# Patient Record
Sex: Male | Born: 1955 | Race: Black or African American | Hispanic: No
Health system: Southern US, Community
[De-identification: ages and names within clinical notes are randomized; demographics above are authoritative.]

---

## 2013-04-22 ENCOUNTER — Inpatient Hospital Stay: Payer: Self-pay | Admitting: Specialist

## 2013-04-22 LAB — COMPREHENSIVE METABOLIC PANEL
Albumin: 4.2 g/dL (ref 3.4–5.0)
Alkaline Phosphatase: 114 U/L (ref 50–136)
BUN: 15 mg/dL (ref 7–18)
Calcium, Total: 9.6 mg/dL (ref 8.5–10.1)
Creatinine: 1.4 mg/dL — ABNORMAL HIGH (ref 0.60–1.30)
EGFR (African American): >60
Osmolality: 288 (ref 275–301)
Potassium: 3.6 mmol/L (ref 3.5–5.1)
Sodium: 141 mmol/L (ref 136–145)
Total Protein: 8.2 g/dL (ref 6.4–8.2)

## 2013-04-22 LAB — ETHANOL
Ethanol %: <0.003 % (ref 0.000–0.080)
Ethanol: <3 mg/dL

## 2013-04-22 LAB — IRON AND TIBC
Iron Bind.Cap.(Total): 401 ug/dL (ref 250–450)
Iron Saturation: 35 %

## 2013-04-22 LAB — DRUG SCREEN, URINE
Amphetamines, Ur Screen: NEGATIVE (ref ?–1000)
Barbiturates, Ur Screen: NEGATIVE (ref ?–200)
Benzodiazepine, Ur Scrn: NEGATIVE (ref ?–200)
Cocaine Metabolite,Ur ~~LOC~~: NEGATIVE (ref ?–300)
MDMA (Ecstasy)Ur Screen: NEGATIVE (ref ?–500)
Methadone, Ur Screen: NEGATIVE (ref ?–300)

## 2013-04-22 LAB — URINALYSIS, COMPLETE
Glucose,UR: NEGATIVE mg/dL (ref 0–75)
Nitrite: NEGATIVE
Ph: 5 (ref 4.5–8.0)
Protein: NEGATIVE
Specific Gravity: 1.029 (ref 1.003–1.030)

## 2013-04-22 LAB — CBC
HCT: 41.9 % (ref 40.0–52.0)
MCH: 23.3 pg — ABNORMAL LOW (ref 26.0–34.0)
MCHC: 30.4 g/dL — ABNORMAL LOW (ref 32.0–36.0)
Platelet: 231 10*3/uL (ref 150–440)
RDW: 17.4 % — ABNORMAL HIGH (ref 11.5–14.5)

## 2013-04-22 LAB — FERRITIN: Ferritin (ARMC): 66 ng/mL (ref 8–388)

## 2013-04-22 LAB — PROTIME-INR
INR: 3.2
Prothrombin Time: 31.7 secs — ABNORMAL HIGH (ref 11.5–14.7)

## 2013-04-22 LAB — VALPROIC ACID LEVEL: Valproic Acid: <3 ug/mL — ABNORMAL LOW

## 2013-04-22 LAB — GAMMA GT: GGT: 141 U/L — ABNORMAL HIGH (ref 5–85)

## 2013-04-22 LAB — LIPID PANEL
Cholesterol: 178 mg/dL (ref 0–200)
HDL Cholesterol: 58 mg/dL (ref 40–60)

## 2013-04-22 LAB — PHENYTOIN LEVEL, TOTAL: Dilantin: 0.6 ug/mL — ABNORMAL LOW (ref 10.0–20.0)

## 2013-04-23 LAB — MAGNESIUM: Magnesium: 1.6 mg/dL — ABNORMAL LOW

## 2013-04-23 LAB — CBC WITH DIFFERENTIAL/PLATELET
Basophil #: 0.1 10*3/uL (ref 0.0–0.1)
Basophil %: 0.8 %
HCT: 35.3 % — ABNORMAL LOW (ref 40.0–52.0)
Lymphocyte #: 1.8 10*3/uL (ref 1.0–3.6)
MCHC: 32.3 g/dL (ref 32.0–36.0)
MCV: 73 fL — ABNORMAL LOW (ref 80–100)
Monocyte #: 0.7 x10 3/mm (ref 0.2–1.0)
Monocyte %: 8.5 %
Neutrophil %: 67.9 %
Platelet: 181 10*3/uL (ref 150–440)
RBC: 4.84 10*6/uL (ref 4.40–5.90)
RDW: 16.4 % — ABNORMAL HIGH (ref 11.5–14.5)

## 2013-04-23 LAB — LIPID PANEL
Cholesterol: 138 mg/dL (ref 0–200)
HDL Cholesterol: 60 mg/dL (ref 40–60)
Ldl Cholesterol, Calc: 61 mg/dL (ref 0–100)
Triglycerides: 87 mg/dL (ref 0–200)
VLDL Cholesterol, Calc: 17 mg/dL (ref 5–40)

## 2013-04-23 LAB — BASIC METABOLIC PANEL
Anion Gap: 5 — ABNORMAL LOW (ref 7–16)
BUN: 8 mg/dL (ref 7–18)
Chloride: 111 mmol/L — ABNORMAL HIGH (ref 98–107)
Co2: 23 mmol/L (ref 21–32)
Creatinine: 0.93 mg/dL (ref 0.60–1.30)
EGFR (African American): >60
EGFR (Non-African Amer.): >60
Glucose: 112 mg/dL — ABNORMAL HIGH (ref 65–99)
Osmolality: 277 (ref 275–301)
Potassium: 3.5 mmol/L (ref 3.5–5.1)

## 2013-04-23 LAB — TROPONIN I: Troponin-I: 0.08 ng/mL — ABNORMAL HIGH

## 2013-04-23 LAB — CK TOTAL AND CKMB (NOT AT ARMC)
CK, Total: 371 U/L — ABNORMAL HIGH (ref 35–232)
CK, Total: 401 U/L — ABNORMAL HIGH (ref 35–232)
CK-MB: 2 ng/mL (ref 0.5–3.6)
CK-MB: 2.1 ng/mL (ref 0.5–3.6)

## 2013-04-23 LAB — PROTIME-INR: Prothrombin Time: 42.5 secs — ABNORMAL HIGH (ref 11.5–14.7)

## 2013-04-23 LAB — HEMOGLOBIN A1C: Hemoglobin A1C: 5.7 % (ref 4.2–6.3)

## 2013-04-24 ENCOUNTER — Inpatient Hospital Stay: Payer: Self-pay | Admitting: Internal Medicine

## 2013-04-24 LAB — COMPREHENSIVE METABOLIC PANEL
Albumin: 4 g/dL (ref 3.4–5.0)
Alkaline Phosphatase: 92 U/L (ref 50–136)
Anion Gap: 11 (ref 7–16)
BUN: 14 mg/dL (ref 7–18)
Calcium, Total: 9.2 mg/dL (ref 8.5–10.1)
Chloride: 106 mmol/L (ref 98–107)
EGFR (Non-African Amer.): >60
Glucose: 132 mg/dL — ABNORMAL HIGH (ref 65–99)
Osmolality: 280 (ref 275–301)
Potassium: 3.6 mmol/L (ref 3.5–5.1)
SGPT (ALT): 26 U/L (ref 12–78)
Total Protein: 8.1 g/dL (ref 6.4–8.2)

## 2013-04-24 LAB — DRUG SCREEN, URINE
Amphetamines, Ur Screen: NEGATIVE (ref ?–1000)
Barbiturates, Ur Screen: NEGATIVE (ref ?–200)
Benzodiazepine, Ur Scrn: NEGATIVE (ref ?–200)
Cannabinoid 50 Ng, Ur ~~LOC~~: NEGATIVE (ref ?–50)
Cocaine Metabolite,Ur ~~LOC~~: NEGATIVE (ref ?–300)
MDMA (Ecstasy)Ur Screen: NEGATIVE (ref ?–500)
Methadone, Ur Screen: NEGATIVE (ref ?–300)
Opiate, Ur Screen: NEGATIVE (ref ?–300)

## 2013-04-24 LAB — CBC
HCT: 38.6 % — ABNORMAL LOW (ref 40.0–52.0)
HGB: 12.2 g/dL — ABNORMAL LOW (ref 13.0–18.0)
MCH: 23.4 pg — ABNORMAL LOW (ref 26.0–34.0)
MCHC: 31.7 g/dL — ABNORMAL LOW (ref 32.0–36.0)
MCV: 74 fL — ABNORMAL LOW (ref 80–100)
Platelet: 205 10*3/uL (ref 150–440)
RBC: 5.24 10*6/uL (ref 4.40–5.90)
WBC: 11.2 10*3/uL — ABNORMAL HIGH (ref 3.8–10.6)

## 2013-04-24 LAB — PROTIME-INR
INR: 2.9
Prothrombin Time: 29.3 secs — ABNORMAL HIGH (ref 11.5–14.7)

## 2013-04-24 LAB — SALICYLATE LEVEL: Salicylates, Serum: 3.1 mg/dL — ABNORMAL HIGH

## 2013-04-24 LAB — TSH: Thyroid Stimulating Horm: 2.4 u[IU]/mL

## 2013-04-24 LAB — ETHANOL: Ethanol: <3 mg/dL

## 2013-04-25 LAB — MAGNESIUM: Magnesium: 1.5 mg/dL — ABNORMAL LOW

## 2013-04-25 LAB — CBC WITH DIFFERENTIAL/PLATELET
Basophil #: 0.1 10*3/uL (ref 0.0–0.1)
Basophil %: 0.8 %
Eosinophil #: 0.2 10*3/uL (ref 0.0–0.7)
Eosinophil %: 2.4 %
HCT: 33.7 % — ABNORMAL LOW (ref 40.0–52.0)
Lymphocyte #: 3.2 10*3/uL (ref 1.0–3.6)
Monocyte #: 0.8 x10 3/mm (ref 0.2–1.0)
Neutrophil #: 4.5 10*3/uL (ref 1.4–6.5)
RBC: 4.62 10*6/uL (ref 4.40–5.90)
RDW: 15.9 % — ABNORMAL HIGH (ref 11.5–14.5)
WBC: 8.8 10*3/uL (ref 3.8–10.6)

## 2013-04-25 LAB — BASIC METABOLIC PANEL
BUN: 13 mg/dL (ref 7–18)
Chloride: 108 mmol/L — ABNORMAL HIGH (ref 98–107)
Co2: 24 mmol/L (ref 21–32)
Creatinine: 0.79 mg/dL (ref 0.60–1.30)
EGFR (Non-African Amer.): >60
Glucose: 115 mg/dL — ABNORMAL HIGH (ref 65–99)
Osmolality: 279 (ref 275–301)

## 2013-04-26 LAB — MAGNESIUM: Magnesium: 1.6 mg/dL — ABNORMAL LOW

## 2013-04-27 LAB — PROTIME-INR
INR: 1.4
Prothrombin Time: 16.9 secs — ABNORMAL HIGH (ref 11.5–14.7)

## 2013-04-27 LAB — MAGNESIUM: Magnesium: 1.4 mg/dL — ABNORMAL LOW

## 2013-04-27 LAB — POTASSIUM: Potassium: 3.6 mmol/L (ref 3.5–5.1)

## 2013-04-28 LAB — CULTURE, BLOOD (SINGLE)

## 2013-04-28 LAB — PHENYTOIN LEVEL, TOTAL: Dilantin: 5.8 ug/mL — ABNORMAL LOW (ref 10.0–20.0)

## 2013-04-28 LAB — MAGNESIUM: Magnesium: 1.7 mg/dL — ABNORMAL LOW

## 2014-08-05 IMAGING — CR DG CHEST 1V PORT
1 series · 2 of 2 positions shown · non-contrast
Comparison: none

REASON FOR EXAM: generalized weakness
COMMENTS:

[Series 1: ap · 0.17mm/px · 2 of 2 slices shown]
[im 1/2]
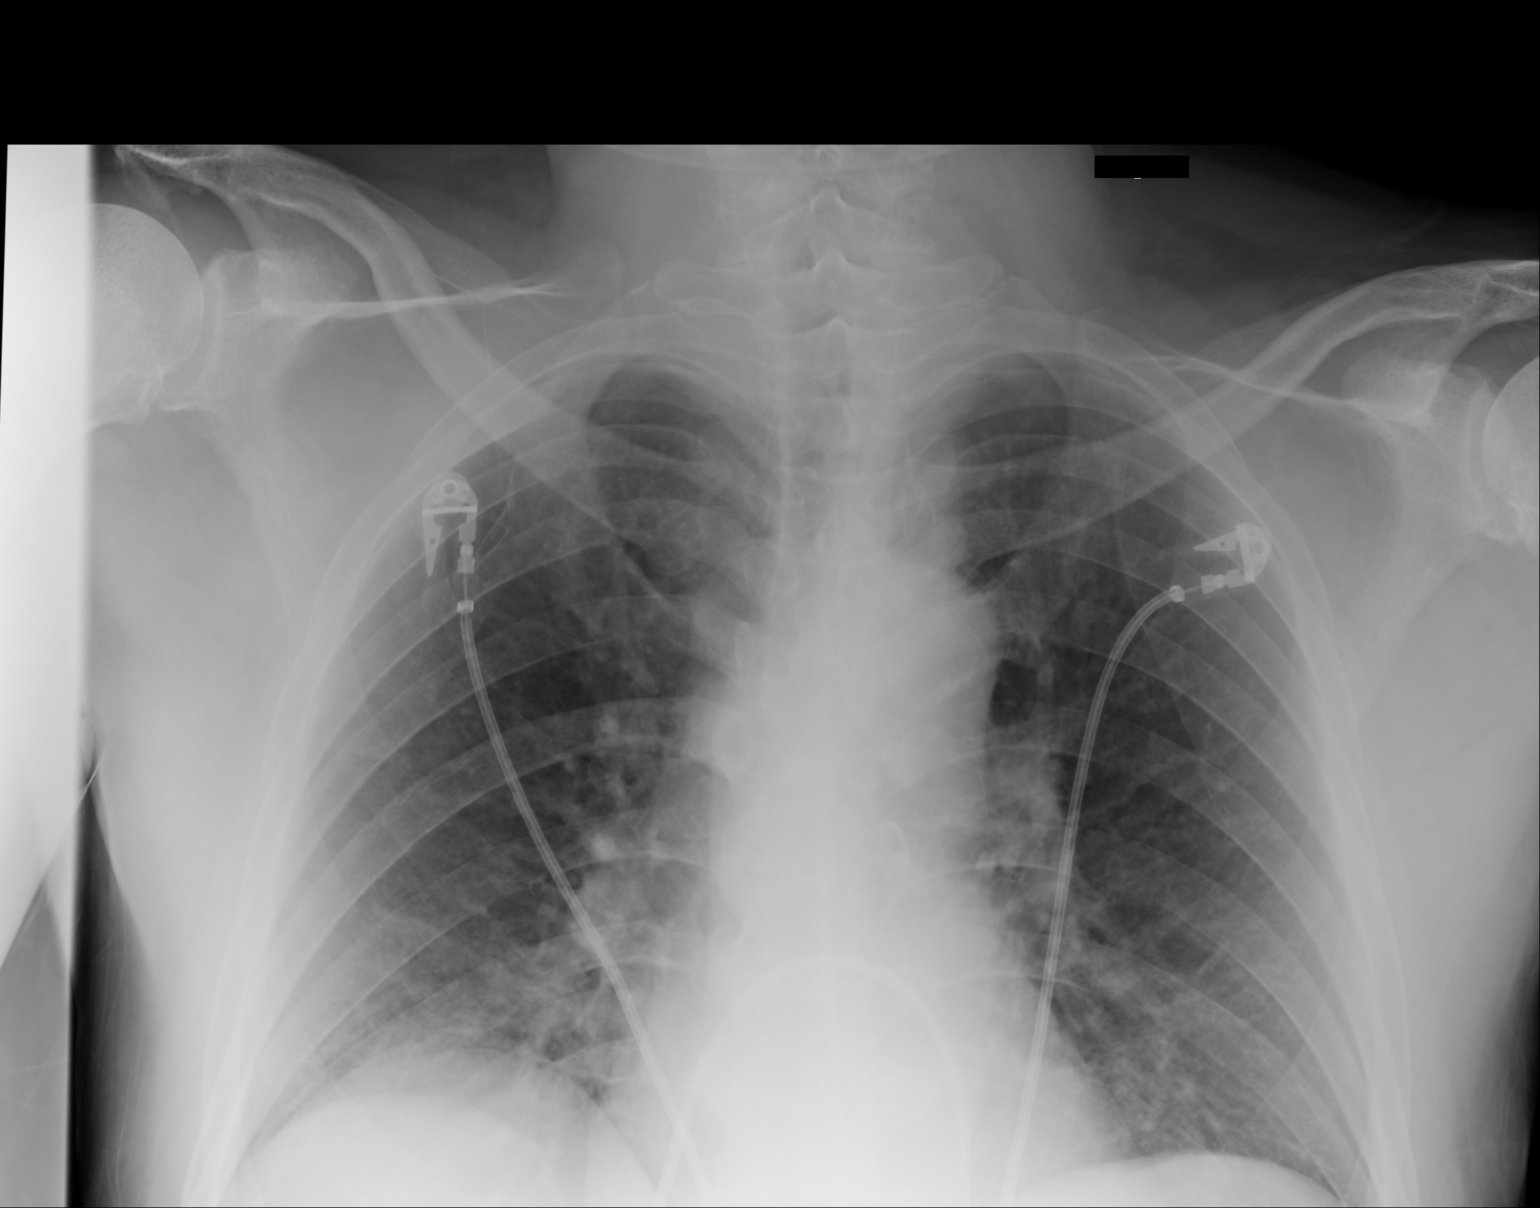
[im 2/2]
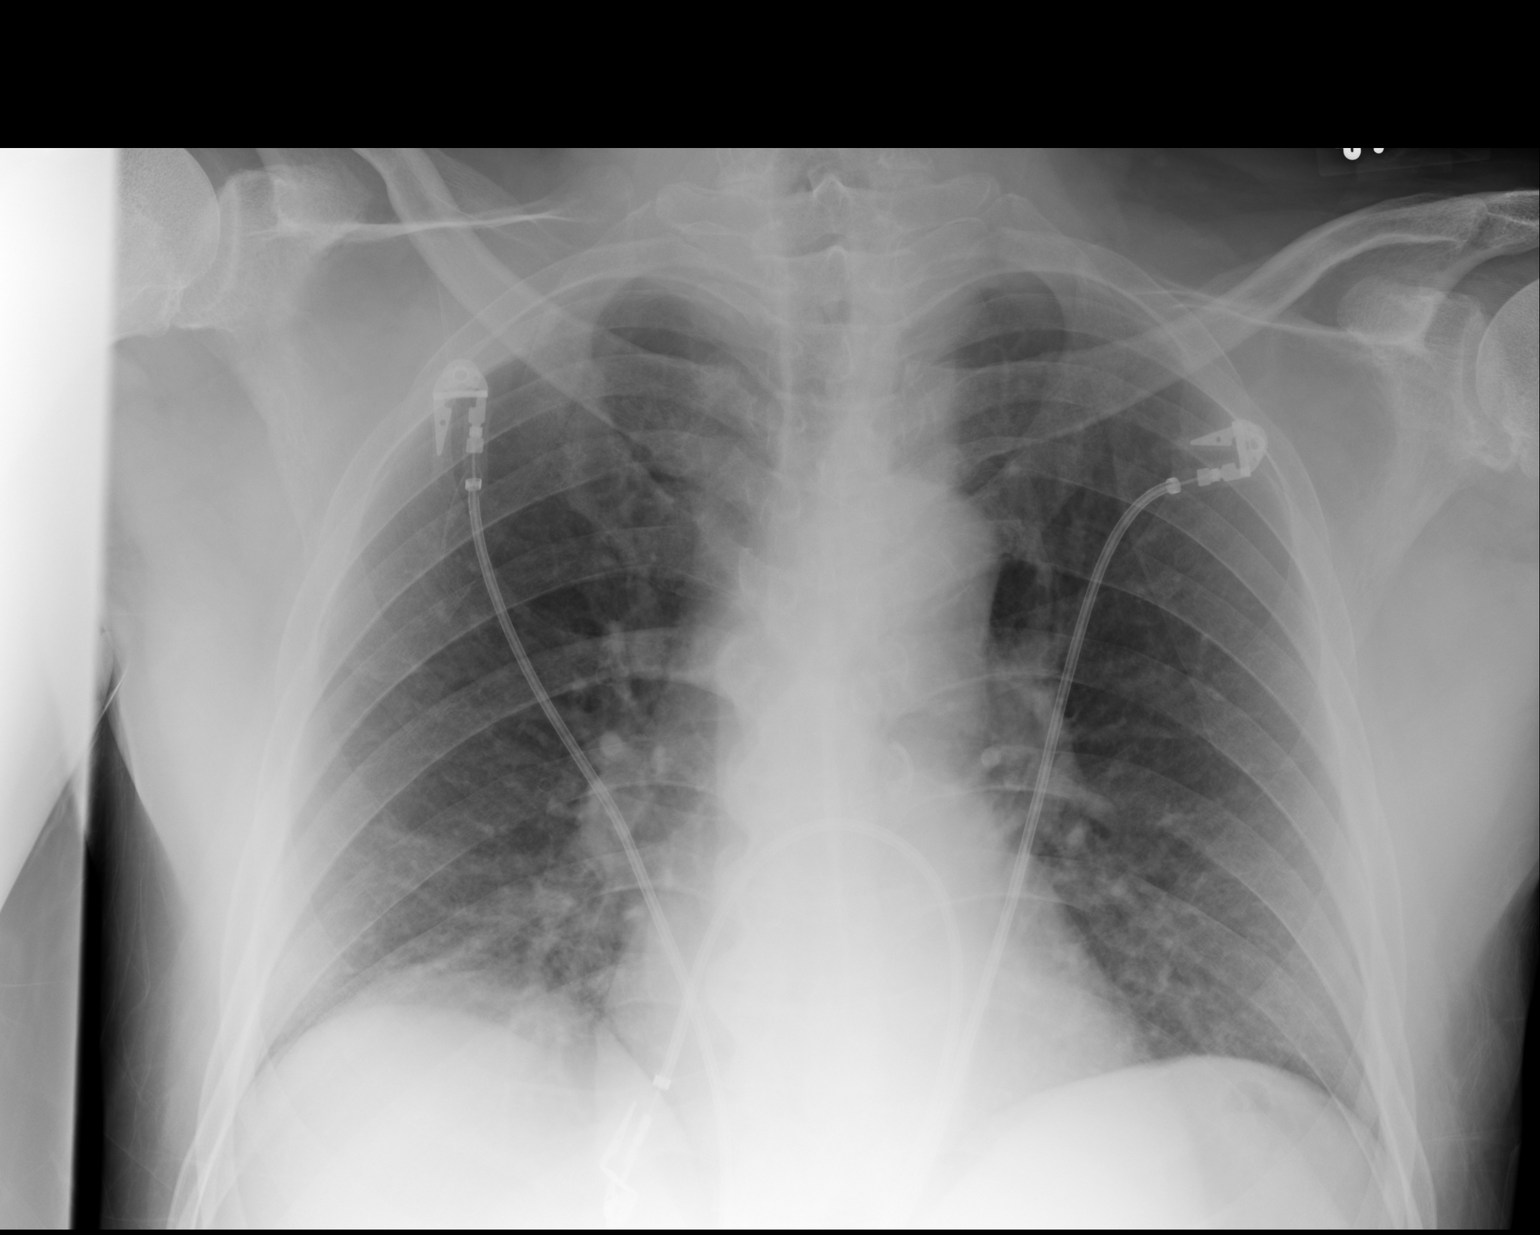

[2 of 2 positions shown; findings below may reference images not displayed]

PROCEDURE:     DXR - DXR PORTABLE CHEST SINGLE VIEW  - April 22, 2013  [DATE]

RESULT:     Cardiac monitoring electrodes are present. There is hazy density
along the right-sided diaphragm. Followup PA and lateral views would be
recommended if the patient can tolerate. The heart is nonenlarged. There is
somewhat shallow inspiratory effort. The left lung is clear. The bony
structures appear unremarkable.
IMPRESSION: 1. Shallow inspiration. Minimal density at the right lung base may represent
atelectasis or infiltrate. Followup PA and lateral views are recommended.

[REDACTED]

## 2015-04-02 NOTE — Consult Note (Signed)
PATIENT NAME:  Allen Frederick, Allen Frederick MR#:  [id number] DATE OF BIRTH:  1956/07/07  DATE OF CONSULTATION:  04/25/2013  REFERRING PHYSICIAN:  Hilda Lias, MD CONSULTING PHYSICIAN:  Braidyn Scorsone K. Sherryll Burger, MD  REASON FOR REFERRAL:  Recurrent seizure and postictal confusion.   HISTORY OF PRESENT ILLNESS:  Allen Frederick is a 59 year old African American gentleman who was [facility] [place] to the [facility] for his regular medical check-up and had a seizure.  I do not have description of what kind of seizure semiology he had, but as the Zenaida Niece was close to [facility] he was brought over here on 04/22/2013.  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   The patient had an EEG which actually showed that patient had a lateralized epileptiform discharges as well as 2 seizures during his 30 minute EEG and it was recommended that patient should get transferred to a facility where there is a continuous EEG monitoring so his antiepileptic medication can be optimized and make sure that he is not having subclinical seizures.   Unfortunately, on May 14 while he was waiting to be transferred to the [facility] even after being accepted at [facility], the patient left against medical advice because he wanted to go "back to home."   He was found wandering by law enforcement knocking on people's door and was confused and was brought to the ER again and kept in the ER under involuntary confinement.   The patient did not have any witnessed seizure, but he was still confused and had "psychosis."   Per report, now patient has been admitted, now he is much more alert and awake and when I entered the room was being evaluated by psychiatry Dr. Toni Amend.   The patient mentioned that he had a stroke back in 1990s when he had meningitis for almost  one month and was hospitalized at [facility] and after discharge a couple of weeks later he had a stroke.  Stroke caused him to have left-sided facial weakness and difficulty with the vision on his right side of the visual field.   He is on anticoagulation, but he could not tell me whether he has atrial fibrillation or what is the cause of him being on anticoagulation with Coumadin.   He is not sure whether he has a hypercoagulable state or not.   He does have other vascular risk factors.   The patient initially seemed to comprehend communication fairly well, but he could not justify why he wanted to leave the hospital even today.   PAST MEDICAL HISTORY:  Significant for hypertension, hyperlipidemia, history of insomnia, history of stroke on chronic anticoagulation.  Stroke is involving his left parieto-occipital region.  He had a recent new onset seizure as far as we know and history of alcohol abuse.  Also had a DUI.   PAST SURGICAL HISTORY:  Unremarkable.   FAMILY HISTORY:  Unobtainable.   SOCIAL HISTORY:  Is significant that he lives alone by himself in [facility].  He smokes and drinks alcohol, but he did not specify the amount and after asking his son he could not tell me as he does not live with him.   MEDICATIONS:  I reviewed his home medication list.   REVIEW OF SYSTEMS:  A 10 system review of system was asked and  was found to be negative except described above.   PHYSICAL EXAMINATION: VITAL SIGNS:  His temperature was 98.5, pulse 100, respiratory rate 20, blood pressure 173/103, and pulse ox 98%.  GENERAL:  Allen Frederick is a middle-aged African American gentleman lying in bed, not in acute distress, is accompanied by his son.    He was alert, but he was not oriented.  He thought that this is the year 2000.  He could not tell me the month or the year.    But he was able to count months of the year backward okay without any mistake December to January.   His immediate  recall was 3 out of 3, but delayed recall was only 1 out of 3.   He said his mood is good.  He denied any active visual or auditory hallucinations.   He did not seem to have any language defect or any neglect.   The patient seemed to have poor judgment as towards the end of the interview he could not explain logically why he still wants to leave today.  He thought that he will be "more comfortable in his house," but he does not have a plan how to get to [place].   On his cranial nerves, his pupils were equal, round, and reactive.  Extraocular movements were intact.  His gross visual field seems to be okay with confrontation.    He has a left facial weakness.  His hearing was intact.  His tongue was midline.   On his motor exam, he had a normal tone and strength 5 out of 5 in upper and lower extremities.   His sensations were intact to light touch.  His deep tendon reflexes were symmetric.   I did not check his gait.   ASSESSMENT AND PLAN: 1.  Seizure, likely new onset seizure as patient is not aware of history of having seizure.  [place]  The [place] [facility] [place].   He has amnesia of the events in between.  He is not aware that he left AGAINST MEDICAL ADVICE yesterday.   The patient has been loaded with fosphenytoin.  I have added levetiracetam.  I think he will benefit from having continuous video EEG monitoring because he noted to have 2 seizures on his previous EEG [facility] and his current state might be coming from his postictal confusion.    It will be very difficult to manage his seizures here without having an EEG if he is having subclinical seizures.   I have also corrected his magnesium.   I agree with psychiatry that the patient does not seem to be able to make a rational decision after repeated explanation.   2.  Stroke.  The  patient had a left middle cerebral artery infarct involving his occipital parietal region.  He is on anticoagulation with Coumadin, but I am not sure whether he has a known atrial fibrillation.  The patient should be kept on telemetry monitoring.   The patient should have tighter control of his blood pressure as his most recent MRI does not show any new ischemic lesion.   The patient should be on a statin.   Please check his hemoglobin A1c and lipid panel.   The patient also has a history of alcohol abuse and a DUI.   I explained this to  patient and his son and his son seems to be in agreement.  The patient preferred to go to [facility] as he is a Cytogeneticist and he will have a "easier transportation from [place] to his home."   Feel free to contact me with any further questions.  I will check out this patient to the weekend covering neurologist.    ____________________________ Renan Danese K. Sherryll Burger, MD hks:ea D: 04/25/2013 22:02:03 ET T: 04/25/2013 23:02:03 ET JOB#: [id number]  cc: Xitlali Kastens K. Sherryll Burger, MD, <Dictator> Durene [facility] MD ELECTRONICALLY SIGNED 05/02/2013 16:45

## 2015-04-02 NOTE — Discharge Summary (Signed)
PATIENT NAME:  Deatra JamesCATLIN, GARY MR#:  [id number] DATE OF BIRTH:  [birth year]  STAT TRANSFER SUMMARY  DATE OF ADMISSION:  04/22/2013 DATE OF DISCHARGE:  04/23/2013  For a detailed note, please see the history and physical done on admission by Dr. Mordecai MaesSanchez. The patient is being urgently transferred to [facility] for continuous EEG monitoring.   CONSULTANTS DURING HOSPITAL COURSE: Dr. Theora MasterZachary Potter from neurology, and  Dr. Adrian BlackwaterShaukat Khan from cardiology.   PERTINENT STUDIES DONE DURING THE HOSPITAL COURSE: Are as follows: CT scan of the head done without contrast on admission showing no acute intracranial process.   A chest x-ray done on admission showing shallow inspirations, minimal density in the right lung base representing atelectasis versus infiltrate.   A 2-dimensional echocardiogram was done, but the results are still pending.   CURRENT MEDICATIONS: Are as follows: Tylenol 650 every 4 hours as needed, Zithromax 500 mg IV q.24 h., ceftriaxone 1 gram q.24 h., insulin, sliding-scale, multivitamin daily, Protonix 40 mg daily, Dilantin 100 mg q.8 hours, and CIWA protocol. Albuterol/ipratropium nebulizers q.6 hours as needed.   DIAGNOSES AT DISCHARGE: Are as follows:  1.  Altered mental status/recurrent seizures.  2.  Elevated troponin, likely in the setting of demand ischemia.  3.  Acquired coagulopathy.  4.  History of alcohol abuse.   BRIEF HOSPITAL COURSE: This is a 59 year old male with a history of hypertension, depression, who presented to the hospital on 04/22/2013 secondary to altered mental status, confusion and noted to have seizures.   1.  Altered mental status/seizures: The exact source of his seizures is unclear. It is also unclear whether he has an underlying history of epilepsy, although he is currently not taking antiepileptics. [facility], and we are trying to obtain some records from there. [place] the [place] traveling [place] noticed him to be having seizures, and was brought to the hospital. While getting his EEG today the patient was noted to have 2 witnessed seizures, and his EEG also abnormal. His CT head on admission was essentially negative. He has been loaded with IV Dilantin and currently is taking p.o. Dilantin, and his Dilantin level is therapeutic. After being seen by neurology, they recommend that the patient have continuous EEG monitoring as his antiepileptics probably need to be further titrated. We do not have continuous EEG monitoring here, and therefore he is being transferred [facility]. They have accepted the patient, Dr. Regan LemmingJoel Morgenlanders is the accepting physician. He will be transferred to [facility] neuro-stepdown, to be specific. The patient currently does not appear to be in a postictal state and is hemodynamically stable, is maintaining his airway and showing no signs of status epilepticus.  2.  Elevated troponin: This is likely in the setting of demand ischemia from seizures. His troponin peaked at 0.14, but they have come down to 0.08. He currently has no chest pain. He has no acute EKG changes. The patient has been seen by cardiology. We are currently awaiting an echocardiogram report. This should be further followed.  3. Acquired coagulopathy: The patient has an INR of 4.8. He apparently is on Coumadin, although he cannot tell us why he is on Coumadin. He does not show any evidence of atrial fibrillation. His CT did not show any evidence of acute stroke. [place] to see why he  is on Coumadin presently. This should be further followed.  4.  History of alcohol abuse: As per the patient, he has a history of heavy drinking. His alcohol level was normal when he presented to the ER. He has not shown any evidence of acute alcohol withdrawal, but is being maintained on some p.r.n. Haldol and  Ativan, and on the CIWA protocol here. The patient is being urgently transferred to [facility] for further evaluation for his recurrent seizures and continuous EEG monitoring.   Time spent is 40 minutes   ____________________________ Rolly Pancake. Cherlynn Kaiser, MD vjs:dm D: 04/23/2013 15:02:02 ET T: 04/23/2013 15:37:05 ET JOB#: [id number]  cc: Rolly Pancake. Cherlynn Kaiser, MD, <Dictator> Houston Siren MD ELECTRONICALLY SIGNED 04/29/2013 14:78

## 2015-04-02 NOTE — Discharge Summary (Signed)
PATIENT NAME:  Carolin CoyCATLIN, Jamis L MR#:  [id number] DATE OF BIRTH:  1956-08-15  DATE OF ADMISSION:  04/22/2013 DATE OF DISCHARGE:  04/23/2013  ADDENDUM   Please take a look at the detailed discharge/transfer summary done by me on 04/23/2013. This is just an addendum. This is a 59 year old male with past medical history of hypertension, depression, history of suspected blood clot, who presented to the hospital with altered mental status and seizures. The patient was noted to have recurrent seizures in the hospital and as per neurology was recommended to [facility] EEG monitoring for his recurrent seizures. The patient was arranged for a transfer to [facility]. although he refused to go to [facility] on the day of transfer and left AGAINST MEDICAL ADVICE on 04/23/2013. The patient did sign the AMA form and he was discharged home.   TIME SPENT: 35 minutes.   ____________________________ Rolly PancakeVivek J. Cherlynn KaiserSainani, MD vjs:jm D: 04/25/2013 16:56:25 ET T: 04/25/2013 23:19:12 ET JOB#: [id number]  cc: Rolly PancakeVivek J. Cherlynn KaiserSainani, MD, <Dictator> Houston SirenVIVEK J Ram Haugan MD ELECTRONICALLY SIGNED 04/29/2013 81:1920:02

## 2015-04-02 NOTE — Consult Note (Signed)
Brief Consult Note: Diagnosis: Elevated TNI.   Comments: Patient had new onset seizure, found to have anemia, elevated WBC ct and in sinus tach. He is confused at this time but denies h/o CAD and CP. Will get echo to check wall motion abnormalities, on warfarin (for unclear reasons), cycle cardiac enzymes. Elevations in TNI may be from demand ischemia. Will follow with you.  Electronic Signatures: Radene KneeKhan, Shaukat Ali (MD)   (Signed 15-May-14 07:51)  Co-Signer: Brief Consult Note Olga Bourbeau A (PA-C)   (Signed 14-May-14 08:57)  Authored: Brief Consult Note  Last Updated: 15-May-14 07:51 by Radene KneeKhan, Shaukat Ali (MD)

## 2015-04-02 NOTE — H&P (Signed)
PATIENT NAME:  Deatra JamesCATLIN, GARY MR#:  [id number] DATE OF BIRTH:  Aug 02, 1956  DATE OF ADMISSION:  04/22/2013  ADDENDUM  Hypertension: We are going to give him metoprolol.  Tachycardia: The patient is on Coumadin. This is likely related to paroxysmal atrial fibrillation. At this moment, his EKG looks like sinus tachycardia versus possible flutter, for what I am going to give him IV metoprolol to slow him down.   I will also add on hydralazine to keep blood pressures in better control due to malignant hypertension. If there are any signs of stroke, we are going to stop all of these medications and allow permissive hypertension.   ____________________________ Felipa Furnaceoberto Sanchez Gutierrez, MD rsg:jm D: 04/22/2013 15:41:27 ET T: 04/22/2013 16:17:51 ET JOB#: [id number]  cc: Felipa Furnaceoberto Sanchez Gutierrez, MD, <Dictator> Antero Derosia Juanda ChanceSANCHEZ GUTIERRE MD ELECTRONICALLY SIGNED 04/24/2013 22:19

## 2015-04-02 NOTE — Consult Note (Signed)
Brief Consult Note: Diagnosis: confusion/delirium due to new onset seizures. Resolving.   Patient was seen by consultant.   Comments: Psychiatry: PAtient seen and chart reviewed. Patient with new onset seizures left the hospital in a confused manner earlier and was brought back by law enforcement. Patient appears to have the ability to make decisions but dose not know what is being recomended by the treatment team or why. Dr Sherryll BurgerShah is seeing pt now. I'll see what he(pt) thinks after the visit from Dr Sherryll BurgerShah.  Electronic Signatures: Audery Amellapacs, John T (MD)  (Signed 16-May-14 19:57)  Authored: Brief Consult Note   Last Updated: 16-May-14 19:57 by Audery Amellapacs, John T (MD)

## 2015-04-02 NOTE — H&P (Signed)
PATIENT NAME:  Allen Frederick, Allen Frederick MR#:  [id number] DATE OF BIRTH:  03-24-1956  DATE OF ADMISSION:  04/22/2013  REFERRING PHYSICIAN: Lowella FairyJohn Woodruff , MD  REASON FOR ADMISSION: New onset seizures.   HISTORY OF PRESENT ILLNESS: Allen Frederick is a very nice 59 year old gentleman who was visiting today at the [facility]. We do not have any previous records or significant information on the chart for this patient and I have been trying to contact the family, but the numbers that I got from the [facility] were not active.  As far information I was able to get from the [facility] was is limited and this was done after [facility] the [facility] and trying to get more clear and specific of his history. As far as what happened today, the patient was visiting the [facility] clinic for just regular routine appointment. Apparently the driver of the [employer], who was transporting the patient, was noticing that he was not acting right and then after he was on the road driving for [phone] minutes, one of the passengers screamed him that somebody was having a seizure. The patient was close to [facility] for what he was brought up the [place] here to the ER. In the ER, he received some Ativan and he was loaded with fosphenytoin and right now he is still postictal. The patient has been of the seizure for over 2 hours. The patient is admitted for further evaluation.   REVIEW OF SYSTEMS: Unable to obtain review of systems due to altered mental status, postictal.   PAST MEDICAL HISTORY: As far as we know the patient has: 1.  Hypertension.  2.  History of insomnia.  3.  Dyslipidemia.  4.  Previous stroke.  5.  Chronic anticoagulation with Coumadin.   I asked the nurse supervisor at the [facility] if the patient has history of atrial fibrillation or any other problems for what he could be on of Coumadin and they were not able to give me this information, they did not know.  It is not stated on his problem list there.   ALLERGIES: No known drug allergies  so far, because the patient cannot give us any information.   PAST SURGICAL HISTORY:  Unable to obtain as the patient is postictal and the family is not able to answer the phone.   FAMILY HISTORY: Also unable to obtain for same reason.   SOCIAL HISTORY: Unknown if the patient smokes or drinks.   MEDICATIONS: We were able to get a list of medications. He takes sertraline 50 mg 1/2 tablet once a day, metoprolol 25 mg twice daily, fish oil 1000 mg once a day, Coumadin 5 mg once daily, aspirin 81 mg once daily. Unable to obtain any other information.   PHYSICAL EXAMINATION: VITAL SIGNS: Blood pressure is 200/91, pulse is 123 to 140, temperature 99.5 rectally, Respirations in between 18 and 20 and oxygen saturation 99% on supplemental oxygen. The patient is starting to wake up a little bit more, but still significantly lethargic, not able to answer questions. He mumbles some words. He is hemodynamically stable.  HEENT: Pupils are equal and reactive. Extraocular movements seem intact, but he is able to track. He does not follow commands yet. His oral mucosae are dry. Anicteric sclerae. Pink conjunctivae. No oral lesions.  NECK: Supple. No JVD. No thyromegaly. No adenopathy. No carotid bruits. No rigidity.  CARDIOVASCULAR: Regular rate and rhythm. No murmurs, rubs or gallops are appreciated. The patient is very tachycardic. No displacement of  PMI.  LUNGS: Mostly clear, but there is decreased respiratory sounds on the right base mostly. No use of accessory muscles. No active cough. No dullness to percussion.  ABDOMEN: Soft, nontender, nondistended. No hepatosplenomegaly. No masses. Bowel sounds are positive.  EXTREMITIES: No edema, no cyanosis, no clubbing. Pulses +2. Capillary refill less than 3. 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 GENITOURINARY: No genital lesions.  MUSCULOSKELETAL: No significant joint effusions swelling or joint  deformity.  SKIN: No rashes, petechiae. Normal turgor.  LYMPHATICS: No lymphadenopathy in neck or supraclavicular areas.  NEUROLOGIC: The patient is lethargic, postictal. Pupils are reactive to light. Cranial nerves are overall grossly intact. There is no droop of the corners of the mouth. The patient is able to open and close both eyes. There is no lack cough of wrinkles at the level of the forehead. Sensation on the face is normal. Babinski is normal. Strength seems to be equal in 4 extremities. The patient is moving 4 extremities. Deep tendon reflexes are +2. Unable to do any cerebellar testing as the patient is not following commands yet.  PSYCHIATRIC: The patient is still lethargic.   LABORATORY, DIAGNOSTIC AND RADIOLOGIC DATA: Blood sugars of 232, creatinine 1.4, chloride 110, CO2 is decreased 15, alcohol level is negative. LFTs with slight elevation of AST at 96.  Dilantin is 0.6. Tegretol is negative. Valproic acid is negative. UDS is negative. White count is 12.5, hemoglobin 12.7, platelet count 231. White count is 4 on the urine. No signs of urinary infection.   Chest x-ray shows some possible beginnings of an infiltrate on the right lower lobe, which is correlating with physical examination; there was decreased respiratory sounds there, so it could be atelectasis versus pneumonia.   ASSESSMENT AND PLAN: A 59 year old gentleman with history of hypertension, previous stroke, dyslipidemia, comes with new onset seizure.  1.  Altered mental status, likely due to metabolic encephalopathy secondary to his seizures. The patient is going to be monitored closely under telemetry and aspiration precautions, nothing by mouth and loaded with seizure medications.  2.  New onset seizure. Work-up for seizure including an MRI, EEG has been ordered. We are going to order a neurology consultation. As far as we know, the patient does not have previous history of seizures. At this moment he is postictal. He is not  able to give me any more information and I have not been able to contact the family to get more details. The patient is going to be loaded with fosphenytoin and given Dilantin oral every 8 hours. Check Dilantin levels. We will keep our eyes open to evaluate other possible causes of seizure other than primary focus. The patient, I am not quite sure if he is a drinker, but he has mild elevation of the AST for what I am going to put him on CIWA protocol just to make sure that he is not going into delirium tremens, as again, his liver enzymes is elevated and his ethanol level is negative, so possibility of delirium tremens.  3.  Infection. The patient had a temperature of 99.1. Possible infiltrate on the lung. At this moment, we are not quite sure if this is a real infection. I am going to get blood cultures and cover him with Rocephin and azithromycin and monitor from here. Stop antibiotics if the x-ray tomorrow is normal and if there is no more fever. At this moment, he does not have any meningeal  signs to think of infectious process, but if the patient continues to be obtunded and there is more fever, we are going to do a lumbar puncture. For now, I am going to hold that.  4.  Hypertension. The patient has malignant hypertension with blood pressures systolics above 200, metoprolol given and ordered some hydralazine.  5.  History of stroke. There  is a possibility also that this could be another stroke. I am going to be gentle with treating the blood pressure, not trying to drop too much, but at if this is related to malignant hypertension, we have to at least treat it. There is no physical signs of stroke at this moment. There is no physical findings neurologically.  The patient is able to move all extremities, even though am going to give him one dose of aspirin and continue his Coumadin. If he had a stroke, his Coumadin was already above 3 for what this very unlikely that a stroke, cause of the seizure. MRI will  help determine if this is a tumor as well.  6.  Elevated white blood count as mentioned above. Rule out the possibility of pneumonia treatment for community-acquired pneumonia started.  7.  Anemia microcytic and hypochromic. The patient is to be evaluated. He is at the age that he could have colon cancer for what iron levels and guaiacs are going to be ordered.  8.  Metabolic acidosis. This is likely secondary to seizure. Continue to monitor start on bicarbonate, if worsening,  9.  Elevated creatinine. I do not have a baseline on this patient I do not know if this is acute or chronic. We are going to put him on IV fluids.  10.  Elevated blood sugars. [facility] is [facility] me that he does not have any history of diabetes. We are going to get a hemoglobin A1c and keep him on Accu-Cheks for now.  11.  Deep vein thrombosis prophylaxis: The patient is supra-therapeutic on his INR with a 2.2. I am going to hold Coumadin for now and monitor closely.  12.  Gastrointestinal prophylaxis. I am going to start him on proton pump inhibitor.    TIME SPENT: I spent about 45 minutes with this patient.    ____________________________ Felipa Furnace, MD rsg:cc D: 04/22/2013 15:35:58 ET T: 04/22/2013 16:23:03 ET JOB#: [id number]  cc: Felipa Furnace, MD, <Dictator> Brittanyann Wittner Juanda Chance MD ELECTRONICALLY SIGNED 04/24/2013 22:20

## 2015-04-02 NOTE — Consult Note (Signed)
Referring Physician:  Vivien Presto :   Primary Care Physician:  Vivien Presto : [facility], [facility], [place], [place], [place] [address], [place] [id number]  Reason for Consult: Admit Date: 24-Apr-2013  Chief Complaint: altered mental status  Reason for Consult: altered mental status   History of Present Illness: History of Present Illness:   59 yo RHD M presents to [facility] via police after being found wandering.  Pt has just diagnosed with seizure with abnormal EEG and then had unusual behavior.  Pt was supposed to go to [facility] for long term monitoring but signed out AMA.  He was then found by the police wandering and was highly agitated at that time.  He was involunteerly commited per psych this admission.  Family is at bedside and helps with the history.  Per them, he was normal until a year ago when he had a stroke and menigitis.  At that time he had lots of memory loss which could have been seizures.  These episodes have been occuring at home but more frequent recently.   He is now at baseline and wants to go home.  ROS:  General denies complaints   HEENT no complaints   Lungs no complaints   Cardiac no complaints   GI no complaints   GU no complaints   Musculoskeletal no complaints   Extremities no complaints   Skin no complaints   Neuro no complaints   Endocrine no complaints   Psych no complaints   Past Medical/Surgical Hx:  CVA/Stroke: Per son Annalee Genta-  has had several mini strokes or strokes  Meningitis: reported by patient's son Freddie  Seizures:   Past Medical/ Surgical Hx:  Past Medical History as above   Past Surgical History none   Home Medications: Medication Instructions Last Modified Date/Time  Metoprolol Tartrate 25 mg oral tablet 1 tab(s) orally 2 times a day 16-May-14 20:19  Fish Oil 1000 mg oral capsule 1 cap(s) orally once a day 16-May-14 20:19  aspirin 81 mg oral tablet 1 tab(s) orally once a  day 16-May-14 20:19  sertraline 50 mg oral tablet 0.5 tab(s) orally once a day 16-May-14 20:19  Coumadin 5 mg oral tablet 1 tab(s) orally once a day except on Thursday 16-May-14 20:19  Coumadin 5 mg oral tablet 1.5 tab(s) orally once a day on Thursday 16-May-14 20:19   Allergies:  Allergy Status Unknown:   Social/Family History: Employment Status: disabled  Lives With: alone  Living Arrangements: apartment  Social History: + tob, no EtOh, no illicits  Family History: n/c   Vital Signs: **Vital Signs.:   18-May-14 13:35  Vital Signs Type Routine  Temperature Temperature (F) 98.7  Celsius 37  Temperature Source oral  Pulse Pulse 65  Respirations Respirations 20  Systolic BP Systolic BP 093  Diastolic BP (mmHg) Diastolic BP (mmHg) 82  Mean BP 95  Pulse Ox % Pulse Ox % 98  Pulse Ox Activity Level  At rest  Oxygen Delivery Room Air/ 21 %   Physical Exam: General: resting comfortable, NAD, normal weight  HEENT: normocephalic, sclera nonicteric, oropharynx clear  Neck: supple, no JVD, no bruits  Chest: CTA B, no wheezing, good movement  Cardiac: RRR, no murmurs, no edema, 2+ pulses  Extremities: no C/C/E, FROM   Neurologic Exam: Mental Status: alert and oriented x 3, normal speech and language, follows complex commands  Cranial Nerves: PERRLA, EOMI, nl VF, face symmetric, tongue midline, shoulder shrug equal  Motor Exam: 5/5 B  normal, tone, no tremor  Deep Tendon Reflexes: 2+/4 B, plantars downgoing B, no Hoffman  Sensory Exam: pinprick, temperature, and vibration intact B  Coordination: FTN and HTS WNL, nl RAM, nl gait   Lab Results: Thyroid:  15-May-14 07:39   Thyroid Stimulating Hormone 2.40 (0.45-4.50 (International Unit)  ----------------------- Pregnant patients have  different reference  ranges for TSH:  - - - - - - - - - -  Pregnant, first trimetser:  0.36 - 2.50 uIU/mL)  Hepatic:  15-May-14 07:39   Bilirubin, Total 1.0  Alkaline Phosphatase 92  SGPT  (ALT) 26  SGOT (AST)  83  Total Protein, Serum 8.1  Albumin, Serum 4.0  TDMs:  15-May-14 07:39   Dilantin, Serum 13.9 (Result(s) reported on 24 Apr 2013 at 06:57PM.)  General Ref:  15-May-14 07:39   Acetaminophen, Serum < 2 (10-30 POTENTIALLY TOXIC:  > 200 mcg/mL  > 50 mcg/mL at 12 hr after  ingestion  > 300 mcg/mL at 4 hr after  ingestion)  Salicylates, Serum  3.1 (0.0-2.8 Therapeutic 2.8-20.0 mg/dL Toxic >30.0 mg/dL)  Routine Chem:  15-May-14 07:39   Ethanol, S. < 3  Ethanol % (comp) < 0.003 (Result(s) reported on 24 Apr 2013 at 08:24AM.)  16-May-14 04:06   Glucose, Serum  115  BUN 13  Creatinine (comp) 0.79  Sodium, Serum 139  Chloride, Serum  108  CO2, Serum 24  Calcium (Total), Serum 8.6  Anion Gap 7  Osmolality (calc) 279  eGFR (African American) >60  eGFR (Non-African American) >60 (eGFR values <70m/min/1.73 m2 may be an indication of chronic kidney disease (CKD). Calculated eGFR is useful in patients with stable renal function. The eGFR calculation will not be reliable in acutely ill patients when serum creatinine is changing rapidly. It is not useful in  patients on dialysis. The eGFR calculation may not be applicable to patients at the low and high extremes of body sizes, pregnant women, and vegetarians.)  18-May-14 04:11   Magnesium, Serum  1.4 (1.8-2.4 THERAPEUTIC RANGE: 4-7 mg/dL TOXIC: > 10 mg/dL  -----------------------)  Potassium, Serum 3.6 (Result(s) reported on 27 Apr 2013 at 05:20AM.)  Urine Drugs:  [id number]  Tricyclic Antidepressant, Ur Qual (comp) NEGATIVE (Result(s) reported on 24 Apr 2013 at 07:13PM.)  Amphetamines, Urine Qual. NEGATIVE  MDMA, Urine Qual. NEGATIVE  Cocaine Metabolite, Urine Qual. NEGATIVE  Opiate, Urine qual NEGATIVE  Phencyclidine, Urine Qual. NEGATIVE  Cannabinoid, Urine Qual. NEGATIVE  Barbiturates, Urine Qual. NEGATIVE  Benzodiazepine, Urine Qual. NEGATIVE (----------------- The URINE DRUG SCREEN provides  only a preliminary, unconfirmed analytical test result and should not be used for non-medical  purposes.  Clinical consideration and professional judgment should be  applied to any positive drug screen result due to possible interfering substances.  A more specific alternate chemical method must be used in order to obtain a confirmed analytical result.  Gas chromatography/mass spectrometry (GC/MS) is the preferred confirmatory method.)  Methadone, Urine Qual. NEGATIVE  Routine Coag:  18-May-14 04:11   Prothrombin  16.9  INR 1.4 (INR reference interval applies to patients on anticoagulant therapy. A single INR therapeutic range for coumarins is not optimal for all indications; however, the suggested range for most indications is 2.0 - 3.0. Exceptions to the INR Reference Range may include: Prosthetic heart valves, acute myocardial infarction, prevention of myocardial infarction, and combinations of aspirin and anticoagulant. The need for a higher or lower target INR must be assessed individually. Reference: The Pharmacology and Management of the Vitamin K  antagonists: the seventh ACCP Conference on Antithrombotic and Thrombolytic Therapy. BEEFE.0712 Sept:126 (3suppl): [id number]. A HCT value >55% may artifactually increase the PT.  In one study,  the increase was an average of 25%. Reference:  "Effect on Routine and Special Coagulation Testing Values of Citrate Anticoagulant Adjustment in Patients with High HCT Values." American Journal of Clinical Pathology 2006;126:400-405.)  Routine Hem:  16-May-14 04:06   WBC (CBC) 8.8  RBC (CBC) 4.62  Hemoglobin (CBC)  10.9  Hematocrit (CBC)  33.7  Platelet Count (CBC) 191  MCV  73  MCH  23.5  MCHC 32.2  RDW  15.9  Neutrophil % 50.8  Lymphocyte % 36.6  Monocyte % 9.4  Eosinophil % 2.4  Basophil % 0.8  Neutrophil # 4.5  Lymphocyte # 3.2  Monocyte # 0.8  Eosinophil # 0.2  Basophil # 0.1 (Result(s) reported on 25 Apr 2013 at 05:18AM.)    Radiology Impression: Radiology Impression: MRI of brain personally reviewed by me and 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  Given the fact that he had a markedly abnormal EEG with PLEDS we must rule out continuation of this and PLEDS are typically seen in CSF infections but can be seen with old strokes as well.  This could explain pts unusual behavior and agitation. Old stroke-  stable, no new stroke, unsure why pt is on coumadin but hx sounds suspicious for venous thrombosis which would warrant coumadin continue coumadin with goal INR 2-3 continue Dilantin 142m TID PO continue Keppra 1gm BID PO needs repeat EEG in the morning, if normal pt may go home;  if abnormal, pt may need LP give another 2gm of Mag now;  pt should have mg level > 2 no driving or operating heavy machinery x 6 months will follow in am but anticipate d/c if EEG is normal  Electronic Signatures: SJamison Neighbor(MD)  (Signed 18-May-14 18:15)  Authored: REFERRING PHYSICIAN, Primary Care Physician, Consult, History of Present Illness, Review of Systems, PAST MEDICAL/SURGICAL HISTORY, HOME MEDICATIONS, ALLERGIES, Social/Family History, NURSING VITAL SIGNS, Physical Exam-, LAB RESULTS, RADIOLOGY RESULTS, Recommendations   Last Updated: 18-May-14 18:15 by SJamison Neighbor(MD)

## 2015-04-02 NOTE — Consult Note (Signed)
PATIENT NAME:  Allen Frederick, GARY MR#:  [id number] DATE OF BIRTH:  [birth year]  DATE OF CONSULTATION:  04/23/2013  CONSULTING PHYSICIAN:  Verta EllenMonica A. Sriya Kroeze, PA-C, dictating for Dr. Adrian BlackwaterShaukat Khan.    REFERRING PHYSICIAN:  Dr. Berlinda LastSanchez Gutierrez.   [facility]  HISTORY OF PRESENT ILLNESS:  Mr. Allen JamesGary Staszewski is a 59 year old African American male. [facility] and during transportation in a [employer], the driver noticed that the patient was not acting "right," and he was possibly having a seizure. The [facility] and was brought to the Emergency Department here. The patient received Ativan and fosphenytoin. During the patient's hospital admission, he was found to have elevated troponin of 0.14.   The patient is somewhat confused, but denies any specific chest pain, chest pressure or history of coronary artery disease. He is on warfarin for unclear reason, possibly for stroke prophylaxis.   PAST MEDICAL HISTORY: 1.  Hypertension.  2.  Insomnia.  3.  Dyslipidemia.  4.  History of previous stroke.  5.  Chronic anticoagulation for Coumadin (this is for unknown reasons, unknown if he has ever had atrial fibrillation).   PAST SURGICAL HISTORY: None.   ALLERGIES: No known drug allergies.   HOME MEDICATIONS: 1.  Sertraline 50 mg 1/2 tablet p.o. daily.  2.  Metoprolol tartrate 25 mg b.i.d.  3.  Fish 1000 mg p.o. daily.  4.  Warfarin 5 mg p.o. daily.  5.  Aspirin 81 mg p.o. daily.   REVIEW OF SYSTEMS:  Unable to be obtained as the patient is in postictal state and very confused.   PHYSICAL EXAMINATION: GENERAL:  This is a pleasant African American male, does not seem to be in any distress.  VITAL SIGNS:  Show a temperature of 98.8 degrees Fahrenheit, heart rate is 96, respiratory rate is 18, blood pressure 163/83, O2 saturation is 99% on room air.  HEENT:  Head atraumatic, normocephalic. Eyes:  Pupils  are round, equal. Conjunctivae are pale, pink. Ears and nose are normal to external inspection.  Mouth: Poor dentition. Moist mucous membranes.  NECK: Supple. Trachea is midline. Thyroid smooth and mobile. There are no carotid bruits appreciated.  LUNGS: Some slightly decreased respiratory sounds on the right base, no use of accessory muscles.  CARDIOVASCULAR: Regular rhythm, although somewhat tachycardic. No murmurs can be appreciated.  ABDOMEN: Nondistended. Bowel sounds are present in all 4 quadrants. It is soft and nontender to palpation.  EXTREMITIES: No cyanosis, clubbing or edema.   ANCILLARY DATA: EKG with sinus tachycardia with PVCs on admission. Chest x-ray, from May 13, shallow inspiration, minimal density at the right lung base, which may represent atelectasis or infiltrate.   CT of the head without contrast: No acute intracranial process.   LABORATORY DATA: Glucose 112, BUN 8, creatinine 0.93, sodium 139, potassium 3.5, chloride 111, CO2 is 23. Estimated GFR is greater than 60. Total cholesterol is 138, triglycerides 87, HDL 60, LDL is 61, TIBC is 401, UIBC  is 260, iron sat is 35%, ferritin is 66. Hemoglobin A1c 6.0. Albumin is 4.2, total bilirubin 0.8, alkaline phosphatase 114, AST 96, ALT 32. GGT is 141. Total CK is 369, CK-MB is 2.1, initial troponin is 0.14, second troponin 0.08. Dilantin level is 13.2. Urine drug screen is negative. White blood cell count is 12.5, hemoglobin is 12.7, hematocrit 41.9, platelet count 231,000. PT 31.7, INR is 3.2.   ASSESSMENT AND PLAN: 1.  Elevated troponin I: The patient's elevation in troponin could be secondary to demand ischemia, as the patient appears to have had recent seizure, has mild underlying anemia, elevated white blood cell count. The patient is in sinus tachycardia and was given metoprolol IV. He is somewhat confused at this time, but denies any chest pain, chest pressure, history of coronary artery disease or heart attack in the past. We  will get echocardiogram to check wall motion abnormalities and continue to cycle cardiac enzymes. He is on warfarin for unclear reasons, unknown if it is for stroke prophylaxis versus a history of atrial fibrillation. We will continue to follow this patient clinically to see if any further intervention is needed.   Thank you very much for this consultation and allowing [place] to participate in this patient's care.    ____________________________ Verta Ellen, PA-C mam:dmm D: 04/23/2013 10:29:53 ET T: 04/23/2013 10:54:44 ET JOB#: [id number]  cc: Verta Ellen, PA-C, <Dictator> Felipa Furnace, MD [facility] ELECTRONICALLY SIGNED 04/24/2013 9:00

## 2015-04-02 NOTE — H&P (Signed)
PATIENT NAME:  Carolin CoyCATLIN, Michele L MR#:  [id number] DATE OF BIRTH:  12/10/1956  DATE OF ADMISSION:  04/24/2013  REFERRING PHYSICIAN:  Dr. Mayford KnifeWilliams.   PRIMARY CARE PHYSICIAN: At the [facility].   REASON FOR ADMISSION:   Altered mental status.   HISTORY OF PRESENT ILLNESS:  The patient is a pleasant 59 year old African American male, who walked out against medical advice last night from the hospital. He was brought in by police after he was found wandering this morning confused and knocking on people's doors. He was brought in by the police and currently is IVC.  Of note, he was admitted on the 13th of May for new onset seizures. He had workup here including an EEG, which did show left hemispheric PLEDs and 2 seizures. The 2 seizures were on the left anterior head region indicating cortical irritability and increased predisposition to focal onset epilepsy from this location.  Per the EEG report, video EEG surveillance was recommended, and as we did not have that in this facility, the patient was in the process of getting transferred to [facility]. He had an accepting physician, however, refused to go last night and walked out AMA. Currently, he is sitting on the bed. He is confused. Hospitalist services were contacted for further evaluation and management, as the case was discussed with neurology at [facility], and at this point there is no bed, and also as patient is committed, he has to be seen by psychiatry and IVC should be taken off. The patient has no complaints; however, he is confused.   PAST MEDICAL HISTORY:  1.  Hypertension.  2.  History of insomnia.  3.  Dyslipidemia.  4.  History of stroke, chronic anticoagulation with Coumadin possibly from a stroke; however, it remains unclear.  5.  History of altered mental status with seizures.  6.  Elevated troponin, likely demand ischemia on a recent admission.  7.  Acquired coagulopathy with elevated INR.  8.  History of alcohol abuse.   PAST SURGICAL HISTORY: Unable  to obtain.   FAMILY HISTORY: Unable to obtain.   SOCIAL HISTORY:  He states he smokes and he drinks, however, do not know the quantity of  either.   MEDICATIONS: On the last discharge summary from yesterday, he was supposed to be on Tylenol 650 mg every 4 hours as needed, Zithromax and ceftriaxone; however, I do not see those on the progress notes; sliding scale insulin, Protonix 40 mg daily, Dilantin 100 mg q.8 hours and CIWA protocol, as well as nebs.   REVIEW OF SYSTEMS:  Unable to obtain full as the patient is pretty confused.   PHYSICAL EXAMINATION: VITAL SIGNS:  Temperature 98, respiratory rate 20, initial pulse is 115, blood pressure was 163/111, O2 sat 100% on room air.  GENERAL: The patient is a well-developed [place] male, sitting at the edge of the bed, in no obvious distress.  HEENT: Normocephalic, atraumatic. Pupils are equal and reactive. Anicteric sclerae. Moist mucous membranes.  NECK: Supple. No thyroid tenderness or cervical lymphadenopathy.  CARDIOVASCULAR: S1, S2, tachycardic. No murmurs, rubs or gallops.  LUNGS: Clear to auscultation without wheezing, rhonchi or rales.  ABDOMEN: Soft, nontender, nondistended. Positive bowel sounds in all quadrants.  EXTREMITIES: No significant lower extremity edema.  SKIN: He has several ecchymoses that appear to be on previous hospitalization IV sites. Otherwise, no significant rashes.  NEUROLOGIC:  Cranial nerves are grossly intact II to XII. Strength is 5/6 in all extremities. Sensation is intact to light touch.  PSYCHIATRIC:  He is awake, not oriented to place, time or person, is verbal but believes he is in [employer]. He thinks he had gone to the police department yesterday and does not remember being in the hospital. He is pleasant and cooperative, however.   LABORATORY, RADIOGRAPHIC AND DIAGNOSTIC DATA: ABG showed pH of 7.29, pCO2 of 46. This morning, salicylate level was 3.1, acetaminophen level below  detection. INR was 2.9, coming down from 4.8; PT of 29.3. WBC 11.2, hemoglobin 12.2, platelets 205, MCV of 74. TSH today is 2.4. CK-MB 1.8, CK total 401. LFTs show AST of 83, otherwise within normal limits. Alcohol level below detection. BNP shows glucose of 132, otherwise BUN, creatinine, sodium, potassium and serum CO2 all within normal limits. MRI of the brain is done, result is pending. Recent echocardiogram yesterday shows normal LV function with elevated left atrial and left ventricular end diastolic pressures and pseudonormal pattern of LV diastolic filling. CAT scan from May 13th shows no acute intracranial process.   ASSESSMENT AND PLAN: We have a 59 year old African American male with history of strokes, insomnia, hypertension, chronic anticoagulation, who walked out against medical advice from the hospital last night, during which he was being treated for new onset seizures with a positive EEG, who walked out against medical advice and did not want to be transferred to [facility]. Currently, he is having acute altered mental status. His mental status is more suggestive of psychosis. He is committed by the ER physician. He denies having any seizures or using any substances. I will check a U-tox. His electrolytes do not show any significant abnormalities. He appears to be neurologically intact. We have placed a psych consult, although it is possible this is postictal state, and he is having postictal psychosis, but other psychiatric etiologies should also be ruled out. We will follow with an MRI of the brain and obtain a neurology consult here. Would resume the Dilantin and check a Dilantin level. He has elevated blood pressure and would continue the metoprolol. We will start him on heparin for DVT prophylaxis. We will also order a sitter and place him under remote tele to look for any significant arrhythmias. I would continue the aspirin, but I will hold the Coumadin for now until I see the MRI.  The case was  also discussed with neurology at [facility] and with Dr. Mayford Knife here at the ER.   TOTAL TIME SPENT: 85 minutes.    ____________________________ Krystal Eaton, MD sa:dmm D: 04/24/2013 18:49:00 ET T: 04/24/2013 19:48:41 ET JOB#: [id number]  cc: Krystal Eaton, MD, <Dictator> Krystal Eaton MD ELECTRONICALLY SIGNED 05/13/2013 12:29

## 2015-04-02 NOTE — Discharge Summary (Signed)
PATIENT NAME:  Allen Frederick, Allen Frederick MR#:  [id number] DATE OF BIRTH:  [birth year]  DATE OF ADMISSION:  04/24/2013 DATE OF DISCHARGE:  04/28/2013  PRIMARY CARE PHYSICIAN: [facility]  DISCHARGE DIAGNOSES: 1.  Metabolic encephalopathy.  2.  Seizure disorder.  3.  Hypertension.  4.  Depression.  5.  History of cerebrovascular accident.  6.  Hypomagnesemia.   MEDICATIONS ON DISCHARGE: Metoprolol 25 mg twice a day, fish oil 1000 mg daily, Zoloft 50 mg 1/2 tablet daily, Coumadin 5 mg 1-1/2 tablets on Thursday and 1 tablet daily every day except for Thursday, Dilantin 200 mg extended-release 1 capsule twice a day, Keppra 1000 mg twice a day, magnesium lactate 84 mg 1 tablet twice a day.   DIET: Low sodium diet, regular consistency.   ACTIVITY: As tolerated.   FOLLOWUP: In [facility] in 1 to 2 weeks. Recommend checking a Coumadin level on Thursday or Friday at the [facility].   REASON FOR ADMISSION: The patient was readmitted 04/24/2013, discharged 04/28/2013.  The patient was admitted with altered mental status.   HISTORY OF PRESENT ILLNESS: A 59 year old man who left the hospital against medical advice on the previous day and was brought in by the police and was involuntarily committed. He was wandering around the neighborhood. He was admitted on May 13th for new onset seizure. The patient was admitted to the hospital on the 15th.  Neurology and psychiatric consultations were obtained.   LABORATORY AND RADIOLOGICAL DATA DURING THE HOSPITAL COURSE INCLUDED:  A B12 of 492. Dilantin 13.9, INR 2.9. TSH 2.4. Salicylates 3.1. White blood cell count 11.2, H and  H 12.2 and 38.6, platelet count of 205. Ethanol level less than 3.0.  Glucose 132, BUN 14, creatinine 1.28, sodium 139, potassium 3.6, chloride 106, CO2 of 22, calcium 9.2. Liver function tests: AST slightly elevated at 83, acetaminophen less 2.   MRI of the brain consistent with small vessel white matter ischemic changes, mild region of chronic  infarction within the left parietal occipital region. Circle of [place] demonstrates findings which likely represent atherosclerotic disease. Indeterminate lesion in the posterior frontal portion of the cranium. Clinical correlation recommended. Urine toxicology negative. Magnesium 1.5. Magnesium upon discharge 1.7, INR upon discharge 1.2, potassium 4.0. Dilantin was 5.8.   HOSPITAL COURSE:  The patient was seen in consultation by Dr. Toni Amendlapacs who involuntarily committed him.  The patient was seen in consultation by Dr. Mellody DrownMatthew Smith, neurology.  A repeat EEG was done on May 19th that showed abnormal EEG, mild evidence of focality.  EEG did not support  seizure focus.  When compared to previous EEG, this was much improved.  No seizures seen.  If  further periods of confusion, follow-up video EEG monitoring should be considered.   PROBLEM LIST:  1.  Metabolic encephalopathy:  The patient gradually improved during the hospitalization.  I am not quite sure what the etiology is, likely postictal from seizure.  2.  Seizure disorder:  The patient was seen in consultation by Neurology, Dr. Mellody DrownMatthew Smith.  Since the Dilantin level was a little bit low prior to discharge at 5.8, we did load the patient again with Cerebyx 500 mg IV x 1 and increased the Dilantin level to 200 mg twice a day. The patient is also on Keppra. If any further seizure activity, needs to be seen at a tertiary care center where they can do the video monitoring.  3.  Hypertension:  On metoprolol.  4.  Depression:  On Zoloft.  5.  History of cerebrovascular accident, on Coumadin:  The patient's Coumadin was initially held, therefore, dropping the patient's INR.  I will go back to the patient's usual dose of Coumadin.  Gave 7.5 mg stat and usually 5 mg daily except for Thursdays where he takes 7.5.  I recommend checking an INR on Friday.  6.  Hypomagnesemia:  This has been replaced daily basis with IV magnesium. Oral magnesium given upon  discharge.  7.  I do recommend close clinical followup at the [facility] in 1 to 2 weeks. I recommend checking a Coumadin level on Thursday or Friday at the [place], can also check a Dilantin level at that time.   The case was also discussed with the patient's son, who will follow along with the patient closely.   TIME SPENT ON DISCHARGE:  35 minutes.   ____________________________ Herschell Dimes. Renae Gloss, MD rjw:cb D: 04/28/2013 15:43:32 ET T: 04/28/2013 21:21:01 ET JOB#: [id number]  cc: Herschell Dimes. Renae Gloss, MD, <Dictator> [facility] Salley Scarlet MD ELECTRONICALLY SIGNED 05/02/2013 13:30

## 2015-04-02 NOTE — Consult Note (Signed)
Psychiatry: Consultation on this patient with new onset seizures primarily to evaluate his competency to make informed decisions. Information obtained from the patient and the chart. Patient has presented with new onset seizures. It had been recommended that he be transferred to [facility] for video EEG monitoring. Instead, he impulsively left the hospital without any particular plan and was brought back by law enforcement who found him wandering about in a confused state. tells me that he was on his way back home from the [facility] hospital and her him when he had a seizure. They brought him to our hospital because it was closest. Patient is aware of what a seizure is. He tells me that he left the hospital because he simply did not want to go to [facility]. Instead, he wanted to go home. His reason for going home is simply that he prefers to be there. He admits that he had no well thought out plan for how he was going to get home when he left the hospital previously. Patient denies any mood symptoms. Denies any suicidal ideation. Denies any hallucinations. Patient tells me that he thinks that he was confused earlier but now he feels like he is thinking clearly. He is able to describe for me his medical conditions and a basic outline of the medicines that he takes. He tells me now that he still wants to leave the hospital tonight. His reason for doing that is that it would be more comfortable at home. By a stroke of luck, Dr. Sherryll BurgerShah came by to speak to the patient just as I was finishing up. Dr. Alver FisherShaw's recommendation was that the patient stay in the hospital at this time and be transferred to another facility for video EEG monitoring. He explained to the patient that this would improve the diagnosis and treatment of his seizure disorder. was alert and awake. He was cooperative and pleasant in interacting with me. Eye contact was good. Psychomotor activity was normal. Speech was normal in tone and rate. Affect was smiling and calm. He  showed relatively little fluctuation in his emotional tone. Mood was stated as being okay. Thoughts did not appear to be grossly disorganized. He denied suicidal or homicidal ideation and denied any hallucinations. Patient was not able to tell me the correct year although he did know the correct date. He did know where he was and could describe how he got here. He was able to repeat 3 words to me and remembered two out of three of him at 5 minutes. He was not able to accurately reproduce a figure drawing. has a past history of meningitis as well as having had blood clots resulting in probable strokes. This is why he takes an anticoagulant medicine. The patient lives independently and takes care of himself basically although he is not able to drive. I did not get a chance to interview his son separately so I don't have a separate view on how impaired the patient is. I had thought that the patient was capable of making informed decisions but after seeing him interact with Dr. Sherryll BurgerShah I have changed my mind. The most telling thing was that the patient told us that his "Nelva Bushtheory" was and that if he were more comfortable been he would necessarily be safer and have less risk from seizures. He did not seem to be able to follow the ill logic of this. In the end, I do not think that he really has full capacity to make this medical decision. I think his  reasoning is illogical and he is not able to process the risks and benefits correctly. I am going to suggest leaving him under the involuntary commitment for now in order to give the hospital the power to keep him in the hospital overnight. The patient does say that he will cooperate with transfer to the [place] because "he has no choice". This is another example of how I don't think he really has a clear understanding of the situation. secondary to multiple causes including a history of meningitis and strokes and probably post ictal affects. not expressing the capacity to make a  reasonable informed decision about his medical care in so far as going to another hospital for further evaluation.  Electronic Signatures: Jewell Haught, Jackquline Denmark (MD)  (Signed on 16-May-14 20:59)  Authored  Last Updated: 16-May-14 20:59 by Audery Amel (MD)

## 2021-01-11 DEATH — deceased
